# Patient Record
Sex: Female | Born: 1945 | Race: White | Hispanic: No | Marital: Married | Smoking: Former smoker
Health system: Southern US, Community
[De-identification: ages and names within clinical notes are randomized; demographics above are authoritative.]

## PROBLEM LIST (undated history)

## (undated) DIAGNOSIS — K219 Gastro-esophageal reflux disease without esophagitis: Secondary | ICD-10-CM

## (undated) DIAGNOSIS — I10 Essential (primary) hypertension: Secondary | ICD-10-CM

## (undated) DIAGNOSIS — C50919 Malignant neoplasm of unspecified site of unspecified female breast: Secondary | ICD-10-CM

## (undated) HISTORY — PX: MASTECTOMY: SHX3

---

## 2003-07-13 ENCOUNTER — Other Ambulatory Visit: Admission: RE | Admit: 2003-07-13 | Discharge: 2003-07-13 | Payer: Self-pay | Admitting: Obstetrics and Gynecology

## 2003-10-26 ENCOUNTER — Ambulatory Visit (HOSPITAL_COMMUNITY): Admission: RE | Admit: 2003-10-26 | Discharge: 2003-10-26 | Payer: Self-pay | Admitting: Gastroenterology

## 2004-07-31 ENCOUNTER — Other Ambulatory Visit: Admission: RE | Admit: 2004-07-31 | Discharge: 2004-07-31 | Payer: Self-pay | Admitting: Obstetrics and Gynecology

## 2005-08-24 ENCOUNTER — Other Ambulatory Visit: Admission: RE | Admit: 2005-08-24 | Discharge: 2005-08-24 | Payer: Self-pay | Admitting: Obstetrics and Gynecology

## 2014-09-20 ENCOUNTER — Emergency Department (HOSPITAL_BASED_OUTPATIENT_CLINIC_OR_DEPARTMENT_OTHER): Payer: Medicare Other

## 2014-09-20 ENCOUNTER — Emergency Department (HOSPITAL_BASED_OUTPATIENT_CLINIC_OR_DEPARTMENT_OTHER)
Admission: EM | Admit: 2014-09-20 | Discharge: 2014-09-20 | Disposition: A | Discharge status: Home / Self Care | Payer: Medicare Other | Attending: Emergency Medicine | Admitting: Emergency Medicine

## 2014-09-20 ENCOUNTER — Encounter (HOSPITAL_BASED_OUTPATIENT_CLINIC_OR_DEPARTMENT_OTHER): Payer: Self-pay | Admitting: Emergency Medicine

## 2014-09-20 DIAGNOSIS — I10 Essential (primary) hypertension: Secondary | ICD-10-CM | POA: Diagnosis not present

## 2014-09-20 DIAGNOSIS — Z87891 Personal history of nicotine dependence: Secondary | ICD-10-CM | POA: Diagnosis not present

## 2014-09-20 DIAGNOSIS — Z853 Personal history of malignant neoplasm of breast: Secondary | ICD-10-CM | POA: Diagnosis not present

## 2014-09-20 DIAGNOSIS — Z9010 Acquired absence of unspecified breast and nipple: Secondary | ICD-10-CM | POA: Diagnosis not present

## 2014-09-20 DIAGNOSIS — R079 Chest pain, unspecified: Secondary | ICD-10-CM | POA: Diagnosis present

## 2014-09-20 DIAGNOSIS — Z79899 Other long term (current) drug therapy: Secondary | ICD-10-CM | POA: Diagnosis not present

## 2014-09-20 DIAGNOSIS — K219 Gastro-esophageal reflux disease without esophagitis: Secondary | ICD-10-CM | POA: Diagnosis not present

## 2014-09-20 HISTORY — DX: Essential (primary) hypertension: I10

## 2014-09-20 HISTORY — DX: Gastro-esophageal reflux disease without esophagitis: K21.9

## 2014-09-20 HISTORY — DX: Malignant neoplasm of unspecified site of unspecified female breast: C50.919

## 2014-09-20 LAB — COMPREHENSIVE METABOLIC PANEL
ALT: 34 U/L (ref 0–35)
AST: 35 U/L (ref 0–37)
Albumin: 4 g/dL (ref 3.5–5.2)
Alkaline Phosphatase: 72 U/L (ref 39–117)
Anion gap: 15 (ref 5–15)
BUN: 27 mg/dL — ABNORMAL HIGH (ref 6–23)
CALCIUM: 10 mg/dL (ref 8.4–10.5)
CO2: 24 meq/L (ref 19–32)
Chloride: 102 mEq/L (ref 96–112)
Creatinine, Ser: 1 mg/dL (ref 0.50–1.10)
GFR calc Af Amer: 67 mL/min — ABNORMAL LOW (ref >90)
GFR, EST NON AFRICAN AMERICAN: 57 mL/min — AB (ref >90)
Glucose, Bld: 115 mg/dL — ABNORMAL HIGH (ref 70–99)
Potassium: 4.4 mEq/L (ref 3.7–5.3)
Sodium: 141 mEq/L (ref 137–147)
TOTAL PROTEIN: 7.4 g/dL (ref 6.0–8.3)
Total Bilirubin: 0.3 mg/dL (ref 0.3–1.2)

## 2014-09-20 LAB — CBC WITH DIFFERENTIAL/PLATELET
Basophils Absolute: 0 10*3/uL (ref 0.0–0.1)
Basophils Relative: 1 % (ref 0–1)
EOS ABS: 0.1 10*3/uL (ref 0.0–0.7)
EOS PCT: 1 % (ref 0–5)
HCT: 41.6 % (ref 36.0–46.0)
HEMOGLOBIN: 14.6 g/dL (ref 12.0–15.0)
LYMPHS ABS: 1.8 10*3/uL (ref 0.7–4.0)
Lymphocytes Relative: 35 % (ref 12–46)
MCH: 30.2 pg (ref 26.0–34.0)
MCHC: 35.1 g/dL (ref 30.0–36.0)
MCV: 86.1 fL (ref 78.0–100.0)
MONOS PCT: 8 % (ref 3–12)
Monocytes Absolute: 0.4 10*3/uL (ref 0.1–1.0)
Neutro Abs: 2.8 10*3/uL (ref 1.7–7.7)
Neutrophils Relative %: 55 % (ref 43–77)
Platelets: 198 10*3/uL (ref 150–400)
RBC: 4.83 MIL/uL (ref 3.87–5.11)
RDW: 12.3 % (ref 11.5–15.5)
WBC: 5.1 10*3/uL (ref 4.0–10.5)

## 2014-09-20 LAB — TROPONIN I
Troponin I: <0.3 ng/mL (ref <0.30)
Troponin I: <0.3 ng/mL (ref <0.30)

## 2014-09-20 MED ORDER — GI COCKTAIL ~~LOC~~
30.0000 mL | Freq: Once | ORAL | Status: AC
  Administered 2014-09-20: 30 mL via ORAL
  Filled 2014-09-20: qty 30

## 2014-09-20 MED ORDER — NITROGLYCERIN 0.4 MG SL SUBL
0.4000 mg | SUBLINGUAL_TABLET | SUBLINGUAL | Status: DC | PRN
  Administered 2014-09-20 (×2): 0.4 mg via SUBLINGUAL
  Filled 2014-09-20 (×2): qty 1

## 2014-09-20 MED ORDER — ASPIRIN 81 MG PO CHEW
324.0000 mg | CHEWABLE_TABLET | Freq: Once | ORAL | Status: AC
  Administered 2014-09-20: 324 mg via ORAL
  Filled 2014-09-20: qty 4

## 2014-09-20 MED ORDER — FAMOTIDINE 20 MG PO TABS: 20.0000 mg | ORAL_TABLET | Freq: Two times a day (BID) | ORAL | Status: AC

## 2014-09-20 MED ORDER — SODIUM CHLORIDE 0.9 % IV BOLUS (SEPSIS)
250.0000 mL | Freq: Once | INTRAVENOUS | Status: AC
  Administered 2014-09-20: 250 mL via INTRAVENOUS

## 2014-09-20 NOTE — ED Notes (Signed)
Pt reports chest pain, onset yesterday morning- reports she thought it was heartburn so she took some antacids without relief-

## 2014-09-20 NOTE — ED Provider Notes (Signed)
CSN: [id number]     Arrival date & time 09/20/14  1229 History   First MD Initiated Contact with Patient 09/20/14 1319     Chief Complaint  Patient presents with  . Chest Pain     (Consider location/radiation/quality/duration/timing/severity/associated sxs/prior Treatment) HPI Comments: Patient is a 68 yo F PMHx significant for h/o breast cancer, HTN, GERD presenting to the ED for acute onset central burning chest pain w/o radiation that began at 8:30AM yesterday morning. Patient states her pain has waxed and waned in severity, but at no point has had 0/10 pain. She tried antacids this morning with no relief. No aggravating factors. She states she walks on the treadmill daily without chest pain. No nausea, vomiting, diaphoresis, shortness of breath. She just states she felt a little lightheaded with severe episodes of pain without syncope. Patient does not have a cardiologist. Patient states she had a stress test over several years ago. No history of echocardiogram or cardiac catheterization.   Past Medical History  Diagnosis Date  . Breast cancer   . Hypertension   . Acid reflux    Past Surgical History  Procedure Laterality Date  . Mastectomy     No family history on file. History  Substance Use Topics  . Smoking status: Former Research scientist (life sciences)  . Smokeless tobacco: Never Used  . Alcohol Use: Not on file   OB History   Grav Para Term Preterm Abortions TAB SAB Ect Mult Living                 Review of Systems  Cardiovascular: Positive for chest pain.  All other systems reviewed and are negative.     Allergies  Other  Home Medications   Prior to Admission medications   Medication Sig Start Date End Date Taking? Authorizing Provider  famotidine (PEPCID) 20 MG tablet Take 1 tablet (20 mg total) by mouth 2 (two) times daily. 09/20/14   Johnmark Geiger L Shereka Lafortune, PA-C   BP 98/63  Pulse 61  Temp(Src) 98.2 F (36.8 C) (Oral)  Resp 16  Ht 5\' 3"  (1.6 m)  Wt 178 lb (80.74 kg)   BMI 31.54 kg/m2  SpO2 96% Physical Exam  Constitutional: She is oriented to person, place, and time. She appears well-developed and well-nourished. No distress.  HENT:  Head: Normocephalic and atraumatic.  Right Ear: External ear normal.  Left Ear: External ear normal.  Nose: Nose normal.  Mouth/Throat: Oropharynx is clear and moist. No oropharyngeal exudate.  Eyes: Conjunctivae are normal.  Neck: Neck supple.  Cardiovascular: Normal rate, regular rhythm and normal heart sounds.   Pulmonary/Chest: Effort normal and breath sounds normal. No respiratory distress.  Abdominal: Soft. There is no tenderness.  Musculoskeletal: She exhibits no edema.  Neurological: She is alert and oriented to person, place, and time.  Skin: Skin is warm and dry. She is not diaphoretic.    ED Course  Procedures (including critical care time) Medications  aspirin chewable tablet 324 mg (324 mg Oral Given 09/20/14 1450)  sodium chloride 0.9 % bolus 250 mL (0 mLs Intravenous Stopped 09/20/14 1528)  gi cocktail (Maalox,Lidocaine,Donnatal) (30 mLs Oral Given 09/20/14 1527)    Labs Review Labs Reviewed  COMPREHENSIVE METABOLIC PANEL - Abnormal; Notable for the following:    Glucose, Bld 115 (*)    BUN 27 (*)    GFR calc non Af Amer 57 (*)    GFR calc Af Amer 67 (*)    All other components within normal limits  CBC WITH  DIFFERENTIAL  TROPONIN I  TROPONIN I    Imaging Review Dg Chest 2 View  09/20/2014   CLINICAL DATA:  Chest pain.  EXAM: CHEST  2 VIEW  COMPARISON:  None.  FINDINGS: Mediastinum and hilar structures are normal. The lungs are clear. Heart size normal. No pleural effusion or pneumothorax. Surgical clips are noted over the right chest. No acute bony abnormality.  IMPRESSION: No acute cardiopulmonary disease.   Electronically Signed   By: Marcello Moores  Register   On: 09/20/2014 14:11     EKG Interpretation   Date/Time:  Thursday September 20 2014 12:36:54 EDT Ventricular Rate:  74 PR Interval:   176 QRS Duration: 82 QT Interval:  380 QTC Calculation: 421 R Axis:   -42 Text Interpretation:  Normal sinus rhythm Left axis deviation Possible  Anterior infarct , age undetermined Abnormal ECG Confirmed by DELOS  MD,  DOUGLAS ([id number]) on 09/20/2014 1:34:15 PM     No significant improvement of CP after nitroglycerin administration.   4:26 PM Patient's pain gone after GI cocktail. Discussed admission vs outpatient follow up for chest pain. Patient requests outpatient follow up with plan to return if CP returns or changes in anyway.   MDM   Final diagnoses:  Chest pain    Filed Vitals:   09/20/14 1600  BP: 98/63  Pulse: 61  Temp:   Resp:    Afebrile, NAD, non-toxic appearing, AAOx4.   Patient is to be discharged with recommendation to follow up with PCP in regards to today's hospital visit. Chest pain is not likely of cardiac or pulmonary etiology d/t presentation,VSS, no tracheal deviation, no JVD or new murmur, RRR, breath sounds equal bilaterally, EKG without acute abnormalities, negative delta troponin after > 24 hours of consecutive pain, and negative CXR. Pain improved after GI cocktail. Discussed inpatient versus outpatient cardiac testing with patient given age and risk factors despite Heart Score of 3, patient requesting discharge home with PCP f/u with further testing. Pt has been advised start a PPI and return to the ED is CP becomes exertional, associated with diaphoresis or nausea, radiates to left jaw/arm, worsens or becomes concerning in any way. Pt appears reliable for follow up and is agreeable to discharge.   Case has been discussed with and seen by Dr. Stark Jock who agrees with the above plan to discharge.       Harlow Mares, PA-C 09/20/14 2124

## 2014-09-20 NOTE — Discharge Instructions (Signed)
Please follow up with your primary care physician in 1-2 days. If you do not have one please call the [facility] number listed above. Please follow up with [facility] to schedule a follow up appointment. Please take medications as prescribed. Please read all discharge instructions and return precautions.    Chest Pain (Nonspecific) It is often hard to give a specific diagnosis for the cause of chest pain. There is always a chance that your pain could be related to something serious, such as a heart attack or a blood clot in the lungs. You need to follow up with your health care provider for further evaluation. CAUSES   Heartburn.  Pneumonia or bronchitis.  Anxiety or stress.  Inflammation around your heart (pericarditis) or lung (pleuritis or pleurisy).  A blood clot in the lung.  A collapsed lung (pneumothorax). It can develop suddenly on its own (spontaneous pneumothorax) or from trauma to the chest.  Shingles infection (herpes zoster virus). The chest wall is composed of bones, muscles, and cartilage. Any of these can be the source of the pain.  The bones can be bruised by injury.  The muscles or cartilage can be strained by coughing or overwork.  The cartilage can be affected by inflammation and become sore (costochondritis). DIAGNOSIS  Lab tests or other studies may be needed to find the cause of your pain. Your health care provider may have you take a test called an ambulatory electrocardiogram (ECG). An ECG records your heartbeat patterns over a 24-hour period. You may also have other tests, such as:  Transthoracic echocardiogram (TTE). During echocardiography, sound waves are used to evaluate how blood flows through your heart.  Transesophageal echocardiogram (TEE).  Cardiac monitoring. This allows your health care provider to monitor your heart rate and rhythm in real time.  Holter monitor. This is a portable device that records your  heartbeat and can help diagnose heart arrhythmias. It allows your health care provider to track your heart activity for several days, if needed.  Stress tests by exercise or by giving medicine that makes the heart beat faster. TREATMENT   Treatment depends on what may be causing your chest pain. Treatment may include:  Acid blockers for heartburn.  Anti-inflammatory medicine.  Pain medicine for inflammatory conditions.  Antibiotics if an infection is present.  You may be advised to change lifestyle habits. This includes stopping smoking and avoiding alcohol, caffeine, and chocolate.  You may be advised to keep your head raised (elevated) when sleeping. This reduces the chance of acid going backward from your stomach into your esophagus. Most of the time, nonspecific chest pain will improve within 2-3 days with rest and mild pain medicine.  HOME CARE INSTRUCTIONS   If antibiotics were prescribed, take them as directed. Finish them even if you start to feel better.  For the next few days, avoid physical activities that bring on chest pain. Continue physical activities as directed.  Do not use any tobacco products, including cigarettes, chewing tobacco, or electronic cigarettes.  Avoid drinking alcohol.  Only take medicine as directed by your health care provider.  Follow your health care provider's suggestions for further testing if your chest pain does not go away.  Keep any follow-up appointments you made. If you do not go to an appointment, you could develop lasting (chronic) problems with pain. If there is any problem keeping an appointment, call to reschedule. SEEK MEDICAL CARE IF:   Your chest pain does not go  away, even after treatment.  You have a rash with blisters on your chest.  You have a fever. SEEK IMMEDIATE MEDICAL CARE IF:   You have increased chest pain or pain that spreads to your arm, neck, jaw, back, or abdomen.  You have shortness of breath.  You have  an increasing cough, or you cough up blood.  You have severe back or abdominal pain.  You feel nauseous or vomit.  You have severe weakness.  You faint.  You have chills. This is an emergency. Do not wait to see if the pain will go away. Get medical help at once. Call your local emergency services (911 in [place] Do not drive yourself to the hospital. MAKE SURE YOU:   Understand these instructions.  Will watch your condition.  Will get help right away if you are not doing well or get worse. Document Released: 08/19/2005 Document Revised: 11/14/2013 Document Reviewed: 06/14/2008 [facility] Patient Information 2015 [place], [place]. This information is not intended to replace advice given to you by your health care provider. Make sure you discuss any questions you have with your health care provider.

## 2014-09-21 NOTE — ED Provider Notes (Signed)
Medical screening examination/treatment/procedure(s) were performed by non-physician practitioner and as supervising physician I was immediately available for consultation/collaboration. Patient is a 77 rolled female who presents for evaluation of chest discomfort. She states that she has had a burning to her chest and epigastric region for the past 24 hours. This was present when she woke up yesterday morning. She's had no relief with antiacids. She has no prior cardiac history and states that she does cardio exercises several times weekly without any discomfort or difficulty breathing.  On exam, vitals are stable and she is afebrile. Head is atraumatic, normocephalic. Neck is supple. Heart is regular rate and rhythm and lungs are clear. Abdomen is soft, nontender, nondistended. Extremities are without edema.  She is feeling better after a GI cocktail. EKG and laboratory studies are unremarkable. Her troponin is negative. We discussed the disposition, however the patient prefers to go home and be followed up as an outpatient. She will be discharged with follow-up to discuss a stress test. She understands to return to the ER if her symptoms worsen or change.   EKG Interpretation   Date/Time:  Thursday September 20 2014 12:36:54 EDT Ventricular Rate:  74 PR Interval:  176 QRS Duration: 82 QT Interval:  380 QTC Calculation: 421 R Axis:   -42 Text Interpretation:  Normal sinus rhythm Left axis deviation Possible  Anterior infarct , age undetermined Abnormal ECG Confirmed by Beau Fanny  MD,  Valon Glasscock ([id number]) on 09/20/2014 1:34:15 PM       Veryl Speak, MD 09/21/14 1531

## 2016-05-24 IMAGING — CR DG CHEST 2V
2 series · 2 of 2 positions shown · non-contrast
Comparison: None.

CLINICAL DATA: Chest pain.

EXAM:
CHEST  2 VIEW

[w chest pa]
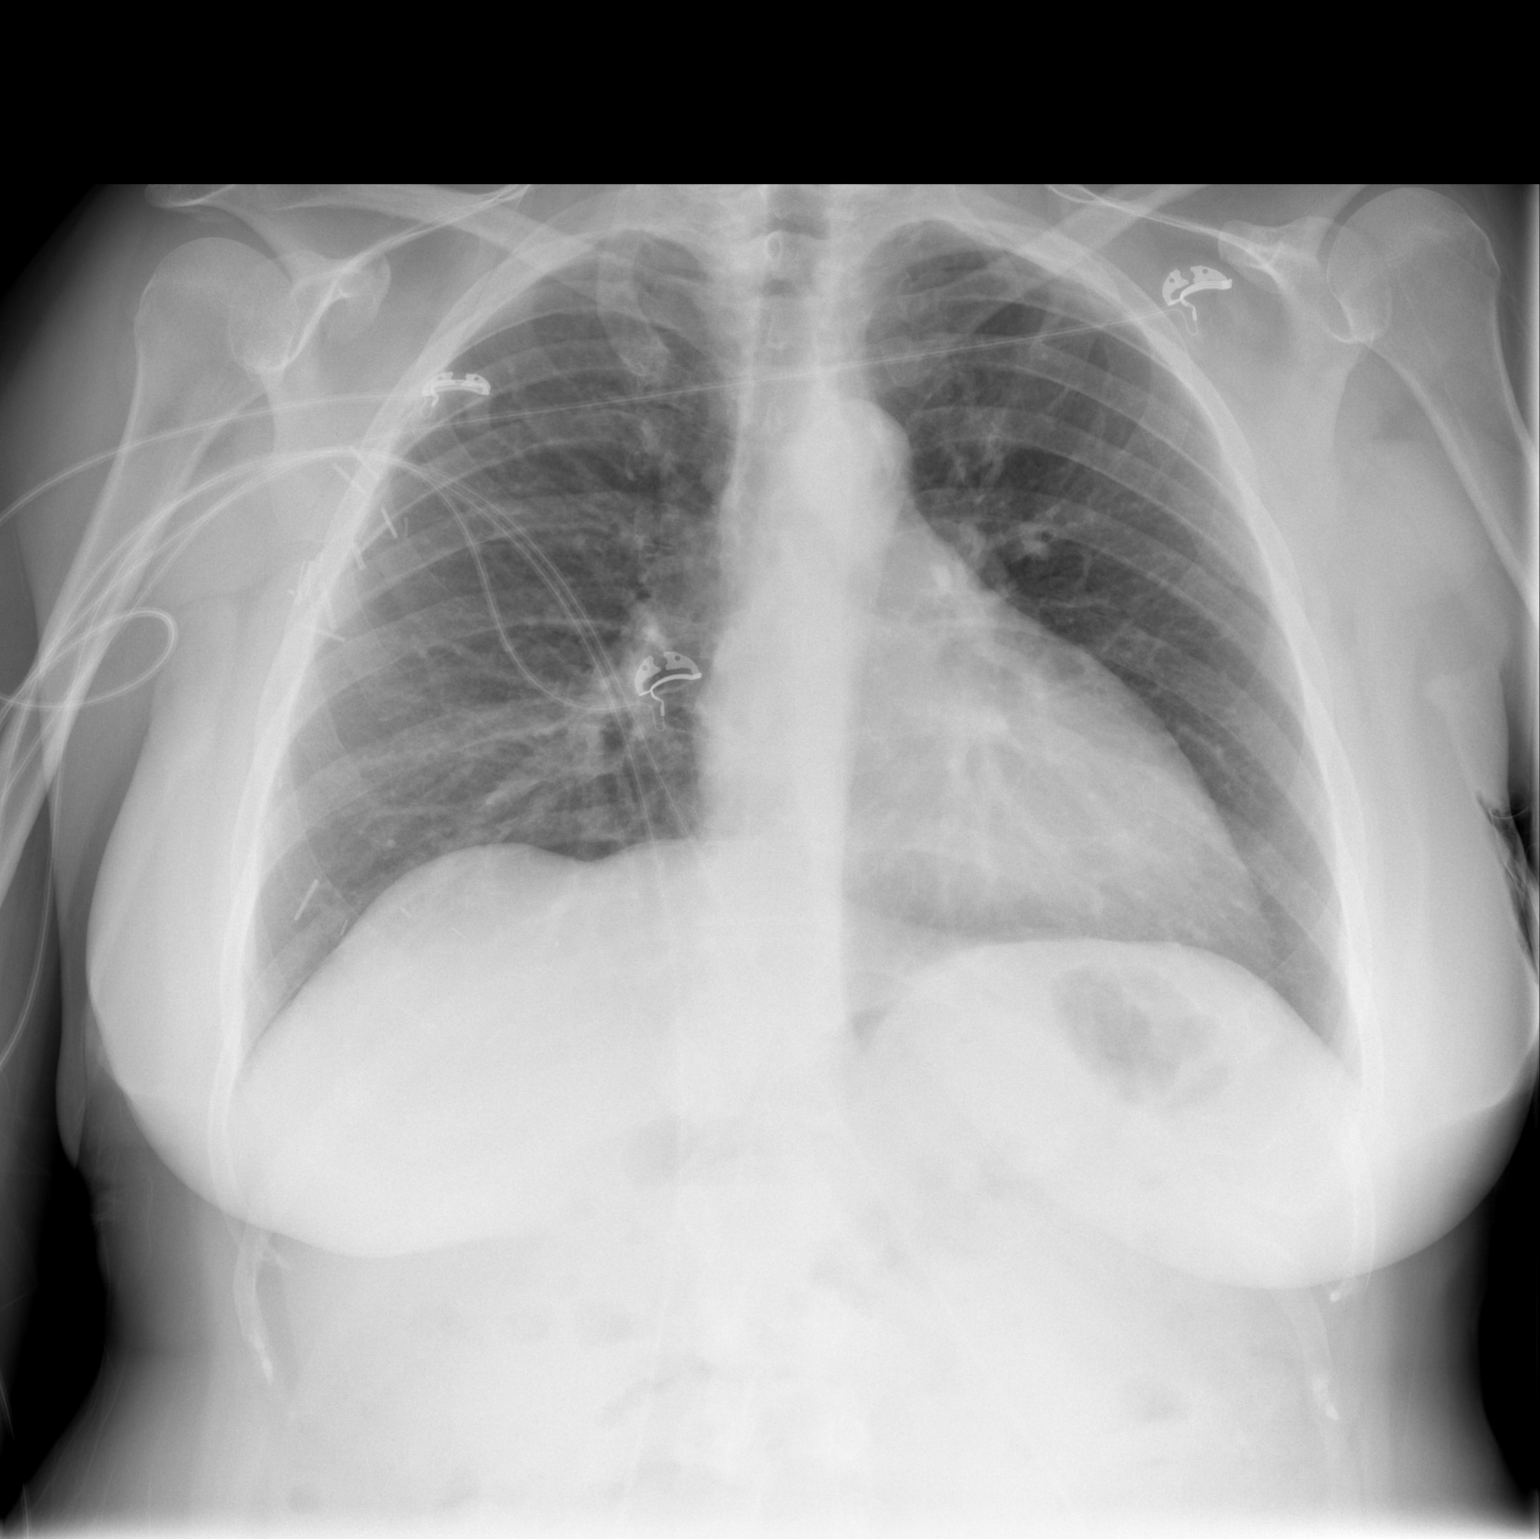

[w chest lat]
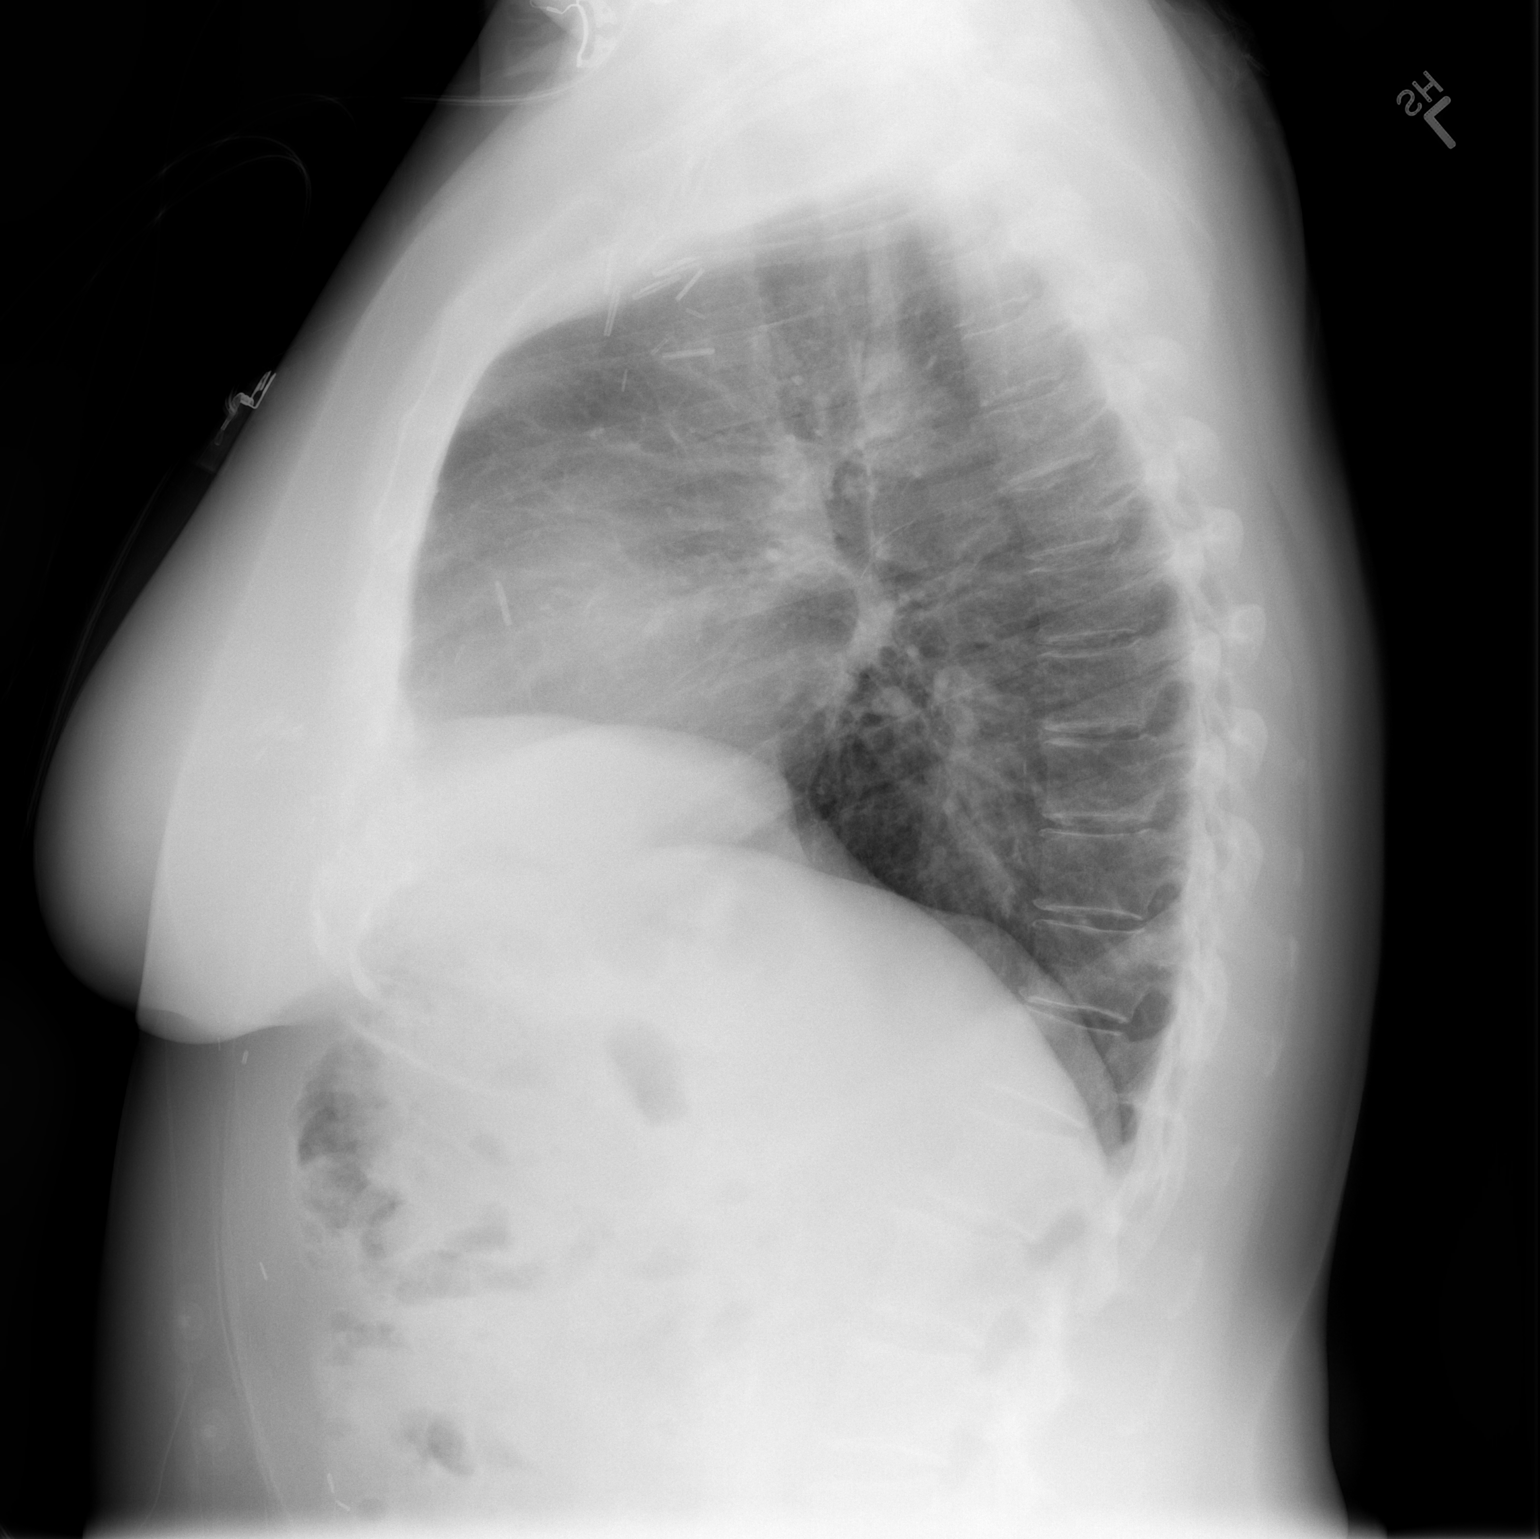

[2 of 2 positions shown; findings below may reference images not displayed]

FINDINGS: Mediastinum and hilar structures are normal. The lungs are clear.
Heart size normal. No pleural effusion or pneumothorax. Surgical
clips are noted over the right chest. No acute bony abnormality.
IMPRESSION: No acute cardiopulmonary disease.
